# Patient Record
Sex: Male | Born: 1965 | Race: White | Hispanic: No | Marital: Married
Health system: Southern US, Community
[De-identification: ages and names within clinical notes are randomized; demographics above are authoritative.]

---

## 2016-03-29 ENCOUNTER — Other Ambulatory Visit: Payer: Self-pay | Admitting: Physical Medicine and Rehabilitation

## 2016-03-29 DIAGNOSIS — M79605 Pain in left leg: Secondary | ICD-10-CM

## 2016-04-05 ENCOUNTER — Ambulatory Visit
Admission: RE | Admit: 2016-04-05 | Discharge: 2016-04-05 | Disposition: A | Discharge status: Home / Self Care | Payer: BLUE CROSS/BLUE SHIELD | Source: Ambulatory Visit | Attending: Physical Medicine and Rehabilitation | Admitting: Physical Medicine and Rehabilitation

## 2016-04-05 DIAGNOSIS — M79605 Pain in left leg: Secondary | ICD-10-CM

## 2016-08-02 ENCOUNTER — Ambulatory Visit (INDEPENDENT_AMBULATORY_CARE_PROVIDER_SITE_OTHER): Payer: BLUE CROSS/BLUE SHIELD | Admitting: Neurology

## 2016-08-02 ENCOUNTER — Other Ambulatory Visit: Payer: Self-pay | Admitting: *Deleted

## 2016-08-02 DIAGNOSIS — M5417 Radiculopathy, lumbosacral region: Secondary | ICD-10-CM

## 2016-08-02 DIAGNOSIS — M79605 Pain in left leg: Secondary | ICD-10-CM | POA: Diagnosis not present

## 2016-08-02 NOTE — Procedures (Signed)
[facility]  [address], [address]  [place], [place] [address] Tel: [phone] Fax:  [phone] Test Date:  08/02/2016  Patient: Michael DoffingDaniel Morse DOB: 07/15/1965 Physician: Nita Sickleonika Patel, DO  Sex: Male Height: 5\' 11"  Ref Phys: Romero BellingWesley Ibazebo, M.D.  ID#: [id number] Temp: 33.0C Technician: Judie PetitM. Dean   Patient Complaints: This is a 51 year old gentleman referred for evaluation of left leg tightness and low back pain. MRI shows neural foraminal stenosis at L4-L5, worse on the left.   NCV & EMG Findings: Extensive electrodiagnostic testing of the left lower extremity shows:  1. Left sural and superficial peroneal nerves are within normal limits. 2. Left peroneal and tibial motor responses are within normal limits. 3. Left tibial H reflex studies within normal limits. 4. Sparse chronic motor axon loss changes are seen affecting the L5 myotome, without accompanied active denervation.  Impression: Chronic L5 radiculopathy affecting the left lower extremity, mild degree electrically.    ___________________________ Nita Sickleonika Patel, DO    Nerve Conduction Studies Anti Sensory Summary Table   Site NR Peak (ms) Norm Peak (ms) P-T Amp (V) Norm P-T Amp  Left Sup Peroneal Anti Sensory (Ant Lat Mall)  12 cm    4.1 <4.6 24.6 >4  Left Sural Anti Sensory (Lat Mall)  Calf    3.9 <4.6 9.0 >4   Motor Summary Table   Site NR Onset (ms) Norm Onset (ms) O-P Amp (mV) Norm O-P Amp Site1 Site2 Delta-0 (ms) Dist (cm) Vel (m/s) Norm Vel (m/s)  Left Peroneal Motor (Ext Dig Brev)  Ankle    3.8 <6.0 7.9 >2.5 B Fib Ankle 8.2 35.0 43 >40  B Fib    12.0  7.2  Poplt B Fib 1.8 10.0 56 >40  Poplt    13.8  7.1         Left Tibial Motor (Abd Hall Brev)  Ankle    4.5 <6.0 6.3 >4 Knee Ankle 8.5 38.0 45 >40  Knee    13.0  5.0          H Reflex Studies   NR H-Lat (ms) Lat Norm (ms) L-R H-Lat (ms) M-Lat (ms) HLat-MLat (ms)  Left Tibial (Gastroc)     34.69 <35  6.53 28.16   EMG   Side Muscle  Ins Act Fibs Psw Fasc Number Recrt Dur Dur. Amp Amp. Poly Poly. Comment  Left AntTibialis Nml Nml Nml Nml 1- Rapid Some 1+ Some 1+ Nml Nml N/A  Left Gastroc Nml Nml Nml Nml Nml Nml Nml Nml Nml Nml Nml Nml N/A  Left Flex Dig Long Nml Nml Nml Nml 1- Rapid Some 1+ Some 1+ Nml Nml N/A  Left RectFemoris Nml Nml Nml Nml Nml Nml Nml Nml Nml Nml Nml Nml N/A  Left GluteusMed Nml Nml Nml Nml 1- Rapid Few 1+ Few 1+ Nml Nml N/A  Left Lumbo Parasp Low Nml Nml Nml Nml Nml Nml Nml Nml Nml Nml Nml Nml N/A  Left BicepsFemS Nml Nml Nml Nml Nml Nml Nml Nml Nml Nml Nml Nml N/A      Waveforms:

## 2017-03-01 IMAGING — MR MR LUMBAR SPINE W/O CM
4 of 5 series · 25 of 48 positions shown · non-contrast
Comparison: None.

CLINICAL DATA: LEFT leg weakness and tightness for 3 years.

EXAM:
MRI LUMBAR SPINE WITHOUT CONTRAST
TECHNIQUE: Multiplanar, multisequence MR imaging of the lumbar spine was
performed. No intravenous contrast was administered.

[Series 2: T2 · sagittal · 4.0mm · 0.42mm/px · 6 of 14 slices shown (1 of 2)]
[im 1/14]
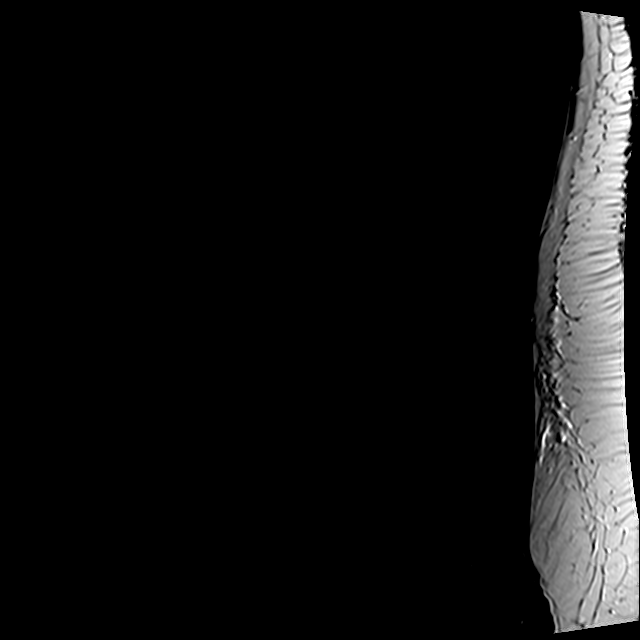
[im 3/14]
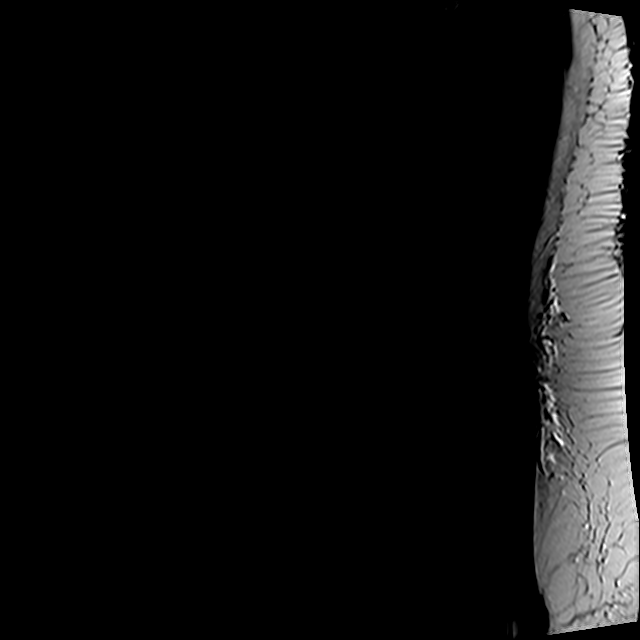
[im 6/14]
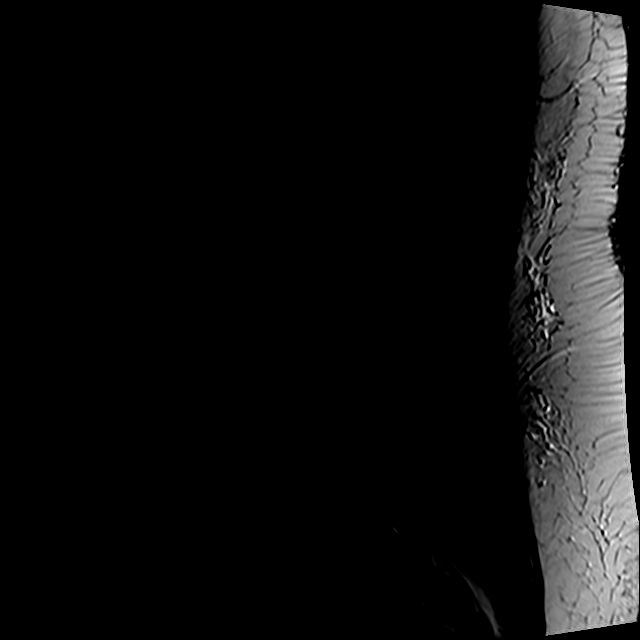
[im 8/14]
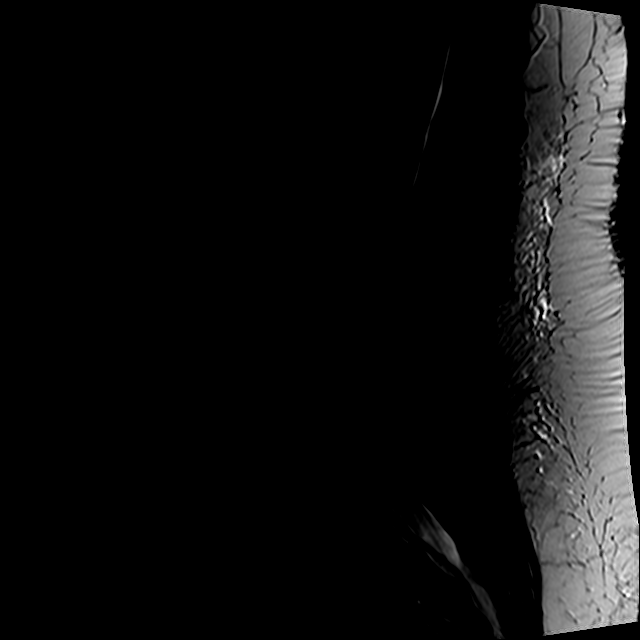
[im 11/14]
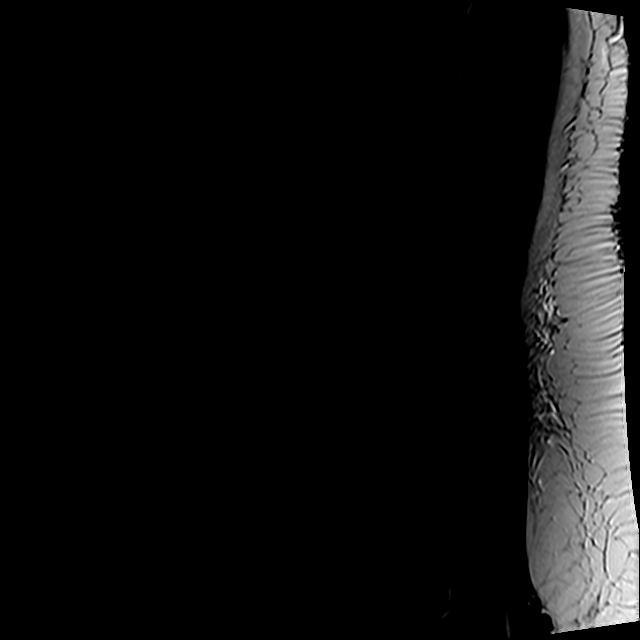
[im 14/14]
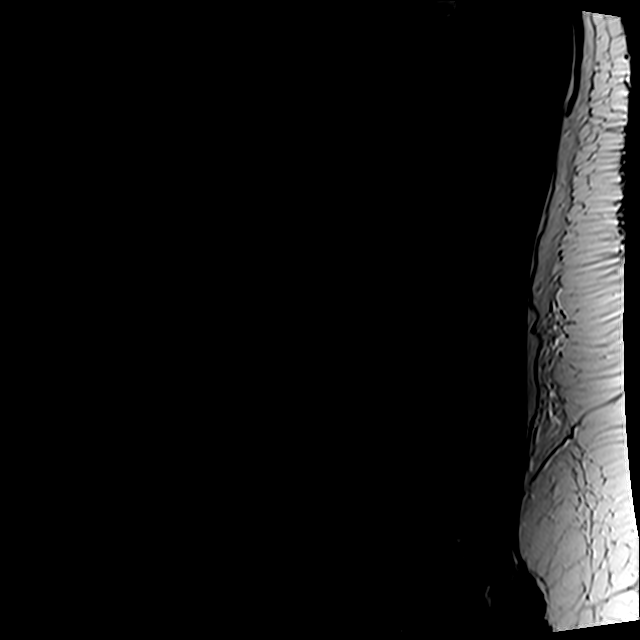

[Series 3: T1 · sagittal · 4.0mm · 0.84mm/px · 6 of 14 slices shown (1 of 2)]
[im 1/14]
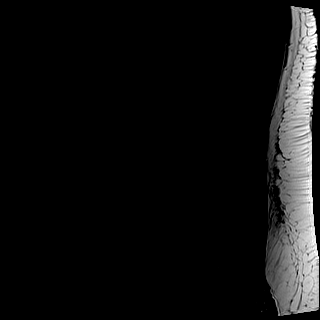
[im 3/14]
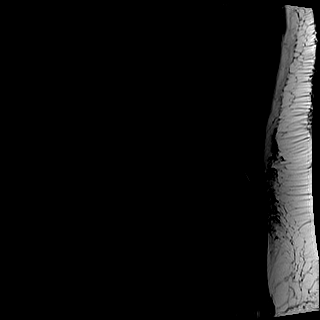
[im 6/14]
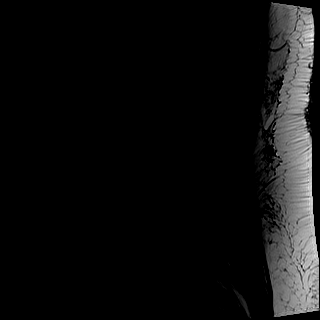
[im 8/14]
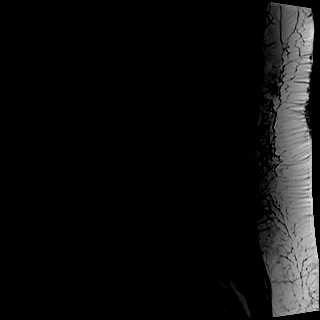
[im 11/14]
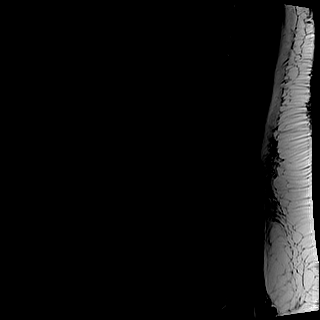
[im 14/14]
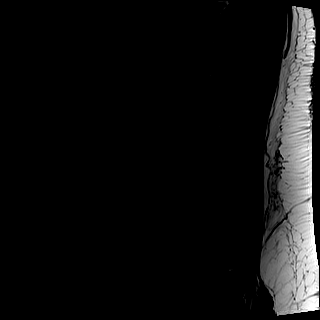

[Series 4: T2 · axial · 4.0mm · 0.64mm/px · z∈[-63,+129]mm · 9 of 36 slices shown (2 of 2)]
[im 1/36]
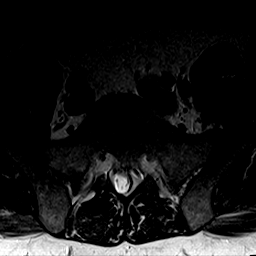
[im 6/36]
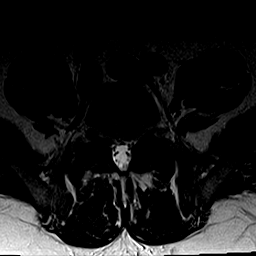
[im 11/36]
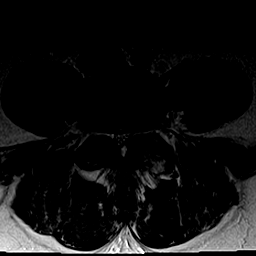
[im 16/36]
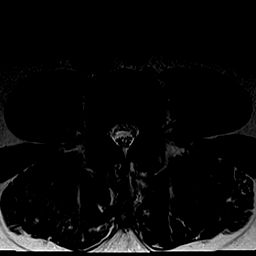
[im 18/36]
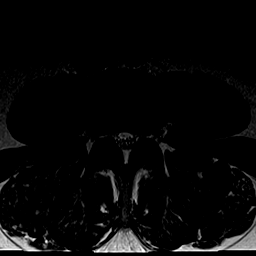
[im 21/36]
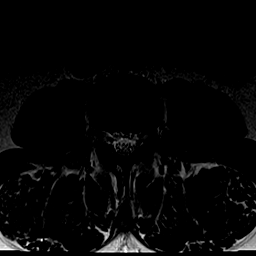
[im 26/36]
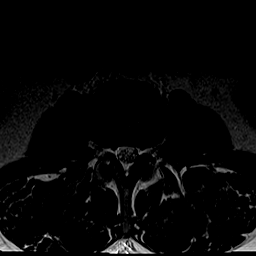
[im 31/36]
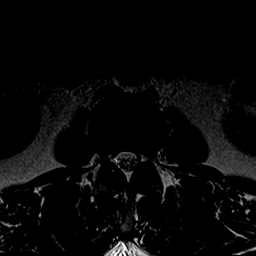
[im 36/36]
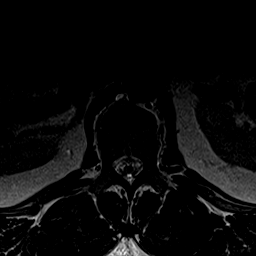

[Series 7: T1 · axial · 4.0mm · 0.32mm/px · z∈[-63,+104]mm · 4 of 36 slices shown (2 of 2)]
[im 1/36]
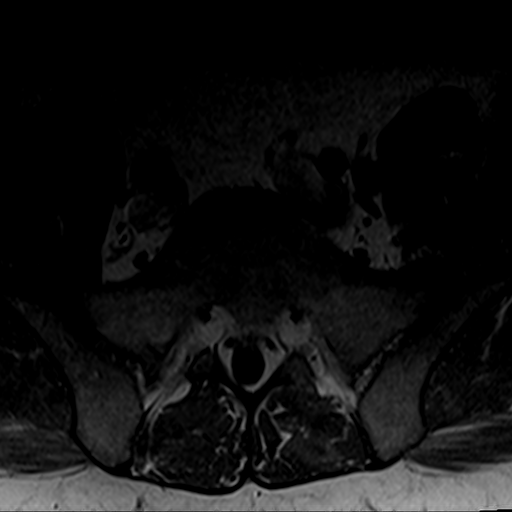
[im 6/36]
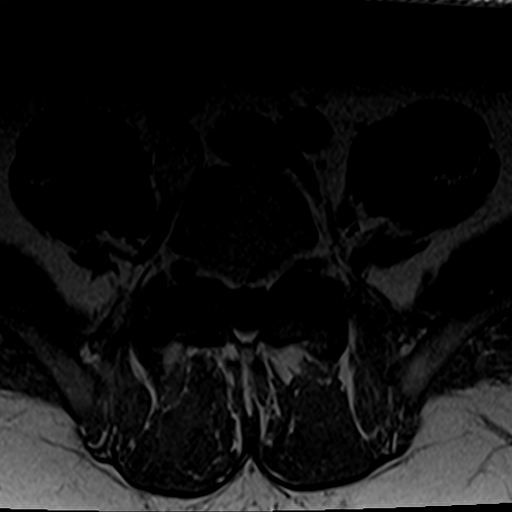
[im 18/36]
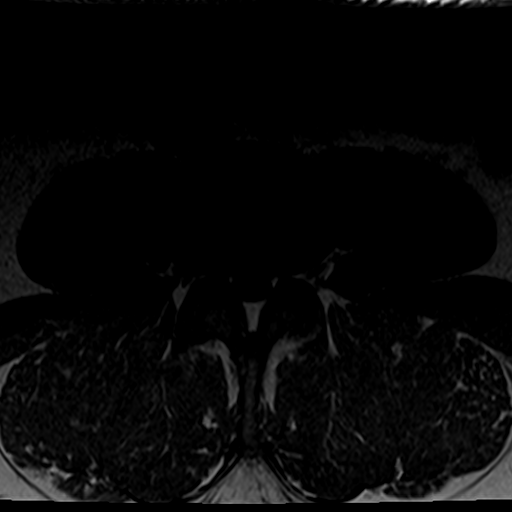
[im 31/36]
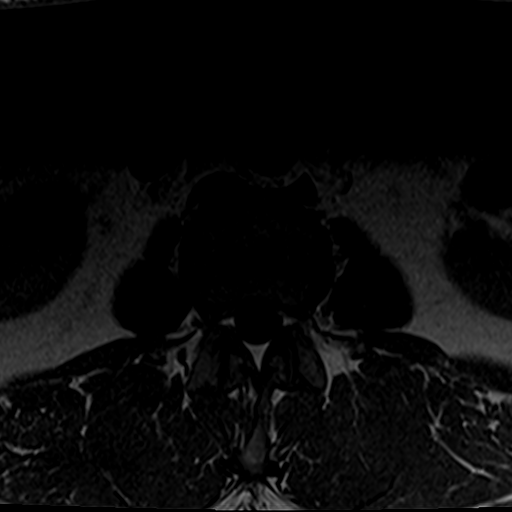

[25 of 48 positions shown; findings below may reference images not displayed]

FINDINGS: Large body habitus results [REDACTED]reased signal to noise ratio.

SEGMENTATION: For the purposes of this report, the last well-formed
intervertebral disc will be described as L5-S1.

ALIGNMENT: No malalignment.  Maintenance of the lumbar lordosis.

VERTEBRAE:Lumbar vertebral bodies are intact. Intervertebral discs
demonstrate normal morphology and signal characteristics. No
abnormal bone marrow signal. Mild congenital canal narrowing on the
basis of foreshortened pedicles. Mild L5-S1 disc height loss, there
may be a component transitional anatomy. Decreased T2 signal within
the L4-5 disc compatible with mild desiccation. Mild subacute on
chronic discogenic endplate changes L2-3. No bone marrow signal
abnormality to suggest acute osseous process.

CONUS MEDULLARIS: Conus medullaris terminates at L1-2 and
demonstrates normal morphology and signal characteristics. Cauda
equina is normal.

PARASPINAL AND SOFT TISSUES: Included prevertebral and paraspinal
soft tissues are normal.

DISC LEVELS:

L1-2, L2-3: No disc bulge, canal stenosis nor neural foraminal
narrowing.

L3-4: Annular bulging. Mild facet arthropathy without canal
stenosis. Mild to moderate bilateral neural foraminal narrowing.

L4-5: 3 mm broad-based disc bulge, central annular fissure. Moderate
to severe facet arthropathy and ligamentum flavum redundancy. 2 mm
RIGHT facet synovial cyst extending into the posterior epidural
space. Moderate canal stenosis. Moderate RIGHT, moderate to severe
LEFT neural foraminal narrowing.

L5-S1: Annular bulging. Severe facet arthropathy without canal
stenosis. Mild bilateral neural foraminal narrowing.
IMPRESSION: Degenerative change of the lumbar spine. Moderate canal stenosis at
L4-5.

L3-4 through L5-S1 neural foraminal narrowing: Moderate to severe on
the LEFT at L4-5.

L4-5 annular fissure.
